# Patient Record
Sex: Male | Born: 1980 | Race: White | Hispanic: No | Marital: Single | Smoking: Never smoker
Health system: Southern US, Community
[De-identification: ages and names within clinical notes are randomized; demographics above are authoritative.]

## PROBLEM LIST (undated history)

## (undated) DIAGNOSIS — R091 Pleurisy: Secondary | ICD-10-CM

## (undated) DIAGNOSIS — L309 Dermatitis, unspecified: Secondary | ICD-10-CM

## (undated) HISTORY — DX: Dermatitis, unspecified: L30.9

---
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
## 2012-03-25 ENCOUNTER — Emergency Department (HOSPITAL_BASED_OUTPATIENT_CLINIC_OR_DEPARTMENT_OTHER)
Admission: EM | Admit: 2012-03-25 | Discharge: 2012-03-25 | Disposition: A | Discharge status: Home / Self Care | Payer: BC Managed Care – PPO | Attending: Emergency Medicine | Admitting: Emergency Medicine

## 2012-03-25 ENCOUNTER — Encounter (HOSPITAL_BASED_OUTPATIENT_CLINIC_OR_DEPARTMENT_OTHER): Payer: Self-pay | Admitting: Family Medicine

## 2012-03-25 DIAGNOSIS — T622X1A Toxic effect of other ingested (parts of) plant(s), accidental (unintentional), initial encounter: Secondary | ICD-10-CM | POA: Insufficient documentation

## 2012-03-25 DIAGNOSIS — L237 Allergic contact dermatitis due to plants, except food: Secondary | ICD-10-CM

## 2012-03-25 DIAGNOSIS — L255 Unspecified contact dermatitis due to plants, except food: Secondary | ICD-10-CM | POA: Insufficient documentation

## 2012-03-25 MED ORDER — DIPHENHYDRAMINE HCL 25 MG PO TABS: 50.0000 mg | ORAL_TABLET | Freq: Four times a day (QID) | ORAL | Status: DC

## 2012-03-25 MED ORDER — PREDNISONE (PAK) 10 MG PO TABS: 10.0000 mg | ORAL_TABLET | Freq: Every day | ORAL | Status: AC

## 2012-03-25 NOTE — ED Provider Notes (Signed)
History     CSN: [id number]  Arrival date & time 03/25/12  1478   First MD Initiated Contact with Patient 03/25/12 1020      Chief Complaint  Patient presents with  . Poison [employer]    face    (Consider location/radiation/quality/duration/timing/severity/associated sxs/prior treatment) HPI Pt presents with itching rash over face, left hand and arm, bilateral feet, left side of neck.  He states he was working outside in the yard several days ago and the next day developed itching rash.  Has been applying benadryl ointment with some relief.  No lip/tongue swelling, no difficulty breathing, no eye involvement.  There are no alleviating or modifying factors, there are no other associated systemic symptoms  History reviewed. No pertinent past medical history.  History reviewed. No pertinent past surgical history.  No family history on file.  History  Substance Use Topics  . Smoking status: Never Smoker   . Smokeless tobacco: Not on file  . Alcohol Use: Yes      Review of Systems ROS reviewed and all otherwise negative except for mentioned in HPI  Allergies  Review of patient's allergies indicates no known allergies.  Home Medications   Current Outpatient Rx  Name Route Sig Dispense Refill  . DIPHENHYDRAMINE HCL 25 MG PO TABS Oral Take 2 tablets (50 mg total) by mouth every 6 (six) hours. Take 1-2 tablets every 6 hours x 2 days, then space out to an as needed basis 20 tablet 0  . PREDNISONE (PAK) 10 MG PO TABS Oral Take 1 tablet (10 mg total) by mouth daily. Take 4 tabs po daily x 4 days, then 3 tabs po daily x 4 days, then 2 tabs po daily x 4 days, then 1 tab po daily x 4 days 40 tablet 0    BP 119/59  Pulse 62  Temp(Src) 98.3 F (36.8 C) (Oral)  Resp 20  Ht 5\' 11"  (1.803 m)  Wt 185 lb (83.915 kg)  BMI 25.80 kg/m2  SpO2 100% Vitals reviewed Physical Exam Physical Examination: General appearance - alert, well appearing, and in no distress Mental status - alert,  oriented to person, place, and time Eyes - pupils equal and reactive, no conjunctival injection Mouth - mucous membranes moist, pharynx normal without lesions Chest - clear to auscultation, no wheezes, rales or rhonchi, symmetric air entry Heart - normal rate, regular rhythm, normal S1, S2, no murmurs, rubs, clicks or gallops Abdomen - soft, nontender, nondistended, no masses or organomegaly Extremities - peripheral pulses normal, no pedal edema, no clubbing or cyanosis Skin - normal coloration and turgor, vesicular/weeping erythematous rash over left forehead, left hand and forearm, left neck, bilateral ankles  ED Course  Procedures (including critical care time)  Labs Reviewed - No data to display No results found.   1. Poison ivy dermatitis       MDM  Pt presenting with pruritic rash with appearance most c/w poison ivy/contact dermatitis.  Pt recommended to take benadryl every 6 hours, will also prescribe steroids as rash is becoming diffuse.  Pt discharged with strict return precautions.  He is agreeable with this plan.         Ethelda Chick, MD 03/25/12 1348

## 2012-03-25 NOTE — ED Notes (Signed)
Pt sts he was working in bushes on Sunday and thinks he has poison ivy to face, neck, arm and feet. Pt currently has benadryl ointment on rash.

## 2012-03-25 NOTE — Discharge Instructions (Signed)
Return to the ED with any concerns including difficulty breathing, vomiting, lip or tongue swelling, eye pain or changes in vision, or any other alarming symptoms

## 2012-09-17 ENCOUNTER — Encounter (HOSPITAL_BASED_OUTPATIENT_CLINIC_OR_DEPARTMENT_OTHER): Payer: Self-pay | Admitting: Emergency Medicine

## 2012-09-17 ENCOUNTER — Emergency Department (HOSPITAL_BASED_OUTPATIENT_CLINIC_OR_DEPARTMENT_OTHER)
Admission: EM | Admit: 2012-09-17 | Discharge: 2012-09-17 | Disposition: A | Discharge status: Home / Self Care | Payer: Worker's Compensation | Attending: Emergency Medicine | Admitting: Emergency Medicine

## 2012-09-17 DIAGNOSIS — Z87891 Personal history of nicotine dependence: Secondary | ICD-10-CM | POA: Insufficient documentation

## 2012-09-17 DIAGNOSIS — S0083XA Contusion of other part of head, initial encounter: Secondary | ICD-10-CM

## 2012-09-17 DIAGNOSIS — Y990 Civilian activity done for income or pay: Secondary | ICD-10-CM | POA: Insufficient documentation

## 2012-09-17 DIAGNOSIS — Y9229 Other specified public building as the place of occurrence of the external cause: Secondary | ICD-10-CM | POA: Insufficient documentation

## 2012-09-17 DIAGNOSIS — IMO0002 Reserved for concepts with insufficient information to code with codable children: Secondary | ICD-10-CM | POA: Insufficient documentation

## 2012-09-17 DIAGNOSIS — S1093XA Contusion of unspecified part of neck, initial encounter: Secondary | ICD-10-CM | POA: Insufficient documentation

## 2012-09-17 DIAGNOSIS — S0003XA Contusion of scalp, initial encounter: Secondary | ICD-10-CM | POA: Insufficient documentation

## 2012-09-17 DIAGNOSIS — Z23 Encounter for immunization: Secondary | ICD-10-CM | POA: Insufficient documentation

## 2012-09-17 DIAGNOSIS — Z79899 Other long term (current) drug therapy: Secondary | ICD-10-CM | POA: Insufficient documentation

## 2012-09-17 DIAGNOSIS — S0181XA Laceration without foreign body of other part of head, initial encounter: Secondary | ICD-10-CM

## 2012-09-17 MED ORDER — TETANUS-DIPHTH-ACELL PERTUSSIS 5-2.5-18.5 LF-MCG/0.5 IM SUSP
0.5000 mL | Freq: Once | INTRAMUSCULAR | Status: AC
  Administered 2012-09-17: 0.5 mL via INTRAMUSCULAR
  Filled 2012-09-17: qty 0.5

## 2012-09-17 NOTE — ED Provider Notes (Signed)
History     CSN: [id number]  Arrival date & time 09/17/12  0109   First MD Initiated Contact with Patient 09/17/12 0123      Chief Complaint  Patient presents with  . Facial Laceration    (Consider location/radiation/quality/duration/timing/severity/associated sxs/prior treatment) HPI This is a 31 year old male who had a strap sling up and strike him on the right forehead. This occurred at work at about 9:30 yesterday evening. He has a laceration above the right eyebrow. There is minimal pain but there was significant bleeding. The bleeding was relieved with pressure and is now hemostatic. He denies neck pain or other injury. His tetanus status not up to date.  History reviewed. No pertinent past medical history.  History reviewed. No pertinent past surgical history.  No family history on file.  History  Substance Use Topics  . Smoking status: Former Games developer  . Smokeless tobacco: Not on file  . Alcohol Use: Yes      Review of Systems  All other systems reviewed and are negative.    Allergies  Review of patient's allergies indicates no known allergies.  Home Medications   Current Outpatient Rx  Name  Route  Sig  Dispense  Refill  . DIPHENHYDRAMINE HCL 25 MG PO TABS   Oral   Take 2 tablets (50 mg total) by mouth every 6 (six) hours. Take 1-2 tablets every 6 hours x 2 days, then space out to an as needed basis   20 tablet   0     BP 147/82  Pulse 89  Temp 98.4 F (36.9 C) (Oral)  Resp 18  Ht 5\' 11"  (1.803 m)  Wt 215 lb (97.523 kg)  BMI 29.99 kg/m2  SpO2 100%  Physical Exam General: Well-developed, well-nourished male in no acute distress; appearance consistent with age of record HENT: normocephalic; superficial contusion and abrasion of the right upper for head; small hematoma with overlying laceration above the right eyebrow Eyes: pupils equal round and reactive to light; extraocular muscles intact Neck: supple; nontender Heart: regular rate and  rhythm Lungs: clear to auscultation bilaterally Abdomen: soft; nondistended Extremities: No deformity; full range of motion Neurologic: Awake, alert and oriented; motor function intact in all extremities and symmetric; no facial droop Skin: Warm and dry Psychiatric: Normal mood and affect    ED Course  Procedures (including critical care time)  LACERATION REPAIR Performed by: Faithanne Verret L Authorized by: Hanley Seamen Consent: Verbal consent obtained. Risks and benefits: risks, benefits and alternatives were discussed Consent given by: patient Patient identity confirmed: provided demographic data Prepped and Draped in normal sterile fashion Wound explored  Laceration Location: Above right eyebrow  Laceration Length: 1.7 cm  No Foreign Bodies seen or palpated  Anesthesia: None   Irrigation method: syringe Amount of cleaning: standard  Skin closure: Dermabond   Patient tolerance: Patient tolerated the procedure well with no immediate complications.    MDM          Hanley Seamen, MD 09/17/12 [phone]

## 2012-09-17 NOTE — ED Notes (Signed)
Pt hit in forehead by metal end of a strap while at work. Lac above right eyebrow.

## 2012-10-04 ENCOUNTER — Emergency Department (HOSPITAL_BASED_OUTPATIENT_CLINIC_OR_DEPARTMENT_OTHER): Payer: Self-pay

## 2012-10-04 ENCOUNTER — Emergency Department (HOSPITAL_BASED_OUTPATIENT_CLINIC_OR_DEPARTMENT_OTHER)
Admission: EM | Admit: 2012-10-04 | Discharge: 2012-10-04 | Disposition: A | Discharge status: Home / Self Care | Payer: Self-pay | Attending: Emergency Medicine | Admitting: Emergency Medicine

## 2012-10-04 ENCOUNTER — Encounter (HOSPITAL_BASED_OUTPATIENT_CLINIC_OR_DEPARTMENT_OTHER): Payer: Self-pay | Admitting: Emergency Medicine

## 2012-10-04 DIAGNOSIS — Z8709 Personal history of other diseases of the respiratory system: Secondary | ICD-10-CM | POA: Insufficient documentation

## 2012-10-04 DIAGNOSIS — J40 Bronchitis, not specified as acute or chronic: Secondary | ICD-10-CM | POA: Insufficient documentation

## 2012-10-04 DIAGNOSIS — R0602 Shortness of breath: Secondary | ICD-10-CM | POA: Insufficient documentation

## 2012-10-04 DIAGNOSIS — Z87891 Personal history of nicotine dependence: Secondary | ICD-10-CM | POA: Insufficient documentation

## 2012-10-04 HISTORY — DX: Pleurisy: R09.1

## 2012-10-04 MED ORDER — ALBUTEROL SULFATE (5 MG/ML) 0.5% IN NEBU
5.0000 mg | INHALATION_SOLUTION | Freq: Once | RESPIRATORY_TRACT | Status: AC
  Administered 2012-10-04: 5 mg via RESPIRATORY_TRACT
  Filled 2012-10-04: qty 1

## 2012-10-04 MED ORDER — ALBUTEROL SULFATE HFA 108 (90 BASE) MCG/ACT IN AERS: 1.0000 | INHALATION_SPRAY | Freq: Four times a day (QID) | RESPIRATORY_TRACT | Status: AC | PRN

## 2012-10-04 MED ORDER — IPRATROPIUM BROMIDE 0.02 % IN SOLN
0.5000 mg | Freq: Once | RESPIRATORY_TRACT | Status: AC
  Administered 2012-10-04: 0.5 mg via RESPIRATORY_TRACT
  Filled 2012-10-04: qty 2.5

## 2012-10-04 MED ORDER — AZITHROMYCIN 250 MG PO TABS: 250.0000 mg | ORAL_TABLET | Freq: Every day | ORAL | Status: DC

## 2012-10-04 NOTE — ED Provider Notes (Signed)
History     CSN: [id number]  Arrival date & time 10/04/12  1226   First MD Initiated Contact with Patient 10/04/12 1303      Chief Complaint  Patient presents with  . Cough  . Shortness of Breath    (Consider location/radiation/quality/duration/timing/severity/associated sxs/prior treatment) Patient is a 31 y.o. male presenting with cough. The history is provided by the patient. No language interpreter was used.  Cough This is a new problem. Episode onset: 4 days. The problem occurs constantly. The problem has been gradually worsening. The cough is productive of sputum. There has been no fever. Associated symptoms include shortness of breath. He has tried nothing for the symptoms. The treatment provided no relief. He is not a smoker. His past medical history is significant for bronchitis.  Pt complains of cough and shortness of breath  Past Medical History  Diagnosis Date  . Pleurisy     No past surgical history on file.  No family history on file.  History  Substance Use Topics  . Smoking status: Former Games developer  . Smokeless tobacco: Not on file  . Alcohol Use: Yes      Review of Systems  Respiratory: Positive for cough and shortness of breath.   All other systems reviewed and are negative.    Allergies  Review of patient's allergies indicates no known allergies.  Home Medications  No current outpatient prescriptions on file.  BP 123/67  Pulse 61  Temp 98.4 F (36.9 C) (Oral)  Resp 22  SpO2 100%  Physical Exam  Nursing note and vitals reviewed. Constitutional: He appears well-developed and well-nourished.  HENT:  Head: Normocephalic and atraumatic.  Right Ear: External ear normal.  Left Ear: External ear normal.  Mouth/Throat: Oropharynx is clear and moist.  Eyes: Pupils are equal, round, and reactive to light.  Neck: Normal range of motion. Neck supple.  Cardiovascular: Normal rate.   Pulmonary/Chest: Effort normal.  Abdominal: Soft.   Musculoskeletal: Normal range of motion.  Neurological: He is alert.  Skin: Skin is warm.  Psychiatric: He has a normal mood and affect.    ED Course  Procedures (including critical care time)  Labs Reviewed - No data to display Dg Chest 2 View  10/04/2012  *RADIOLOGY REPORT*  Clinical Data: Cough  CHEST - 2 VIEW  Comparison: None.  Findings: Cardiomediastinal silhouette is unremarkable.  No acute infiltrate or pleural effusion.  No pulmonary edema.  Thoracic dextroscoliosis.  IMPRESSION:  No active disease .  Thoracic dextroscoliosis.   Original Report Authenticated By: Natasha Mead, M.D.      No diagnosis found.    MDM  Pt given albuterol and atrovent neb with relief.   Pt given rx for albuterol and zithromax.   Pt given primary care referrals       Lonia Skinner [place], [place] 10/04/12 1415

## 2012-10-04 NOTE — ED Notes (Signed)
Pt having chest congestion, productive cough, brown sputum x 4 days.  Some sob.  Worse sob with exertion.  No known fever.  Some audible wheezing.

## 2012-10-04 NOTE — ED Notes (Signed)
PA at bedside.

## 2012-10-05 NOTE — ED Provider Notes (Signed)
History/physical exam/procedure(s) were performed by non-physician practitioner and as supervising physician I was immediately available for consultation/collaboration. I have reviewed all notes and am in agreement with care and plan.   Lindell Tussey S Dasani Crear, MD 10/05/12 1519 

## 2012-11-04 ENCOUNTER — Emergency Department (HOSPITAL_BASED_OUTPATIENT_CLINIC_OR_DEPARTMENT_OTHER)
Admission: EM | Admit: 2012-11-04 | Discharge: 2012-11-04 | Disposition: A | Discharge status: Home / Self Care | Payer: Self-pay | Attending: Emergency Medicine | Admitting: Emergency Medicine

## 2012-11-04 ENCOUNTER — Encounter (HOSPITAL_BASED_OUTPATIENT_CLINIC_OR_DEPARTMENT_OTHER): Payer: Self-pay | Admitting: *Deleted

## 2012-11-04 ENCOUNTER — Emergency Department (HOSPITAL_BASED_OUTPATIENT_CLINIC_OR_DEPARTMENT_OTHER): Payer: Self-pay

## 2012-11-04 DIAGNOSIS — Z8709 Personal history of other diseases of the respiratory system: Secondary | ICD-10-CM | POA: Insufficient documentation

## 2012-11-04 DIAGNOSIS — J40 Bronchitis, not specified as acute or chronic: Secondary | ICD-10-CM | POA: Insufficient documentation

## 2012-11-04 DIAGNOSIS — Z87891 Personal history of nicotine dependence: Secondary | ICD-10-CM | POA: Insufficient documentation

## 2012-11-04 MED ORDER — HYDROCOD POLST-CHLORPHEN POLST 10-8 MG/5ML PO LQCR: 5.0000 mL | Freq: Two times a day (BID) | ORAL | Status: DC | PRN

## 2012-11-04 MED ORDER — AZITHROMYCIN 250 MG PO TABS: 250.0000 mg | ORAL_TABLET | Freq: Every day | ORAL | Status: DC

## 2012-11-04 NOTE — ED Notes (Signed)
Patient transported to X-ray 

## 2012-11-04 NOTE — ED Provider Notes (Signed)
History     CSN: [id number]  Arrival date & time 11/04/12  5284   First MD Initiated Contact with Patient 11/04/12 0335      Chief Complaint  Patient presents with  . Cough    (Consider location/radiation/quality/duration/timing/severity/associated sxs/prior treatment) HPI This is a 32 year old male with several days of cold symptoms. Specifically he has been having nasal congestion, sinus drainage and cough. The cough has been nonproductive. It worsened this morning. He also had chills this morning. He is not sure if he is had a fever. He has had posttussive emesis as a result of coughing spells. He has not had diarrhea. He has an albuterol inhaler but has not been using it.  Past Medical History  Diagnosis Date  . Pleurisy     History reviewed. No pertinent past surgical history.  History reviewed. No pertinent family history.  History  Substance Use Topics  . Smoking status: Former Games developer  . Smokeless tobacco: Not on file  . Alcohol Use: Yes      Review of Systems  All other systems reviewed and are negative.    Allergies  Review of patient's allergies indicates no known allergies.  Home Medications   Current Outpatient Rx  Name  Route  Sig  Dispense  Refill  . ALBUTEROL SULFATE HFA 108 (90 BASE) MCG/ACT IN AERS   Inhalation   Inhale 1-2 puffs into the lungs every 6 (six) hours as needed for wheezing.   1 Inhaler   0   . AZITHROMYCIN 250 MG PO TABS   Oral   Take 1 tablet (250 mg total) by mouth daily. Take first 2 tablets together, then 1 every day until finished.   6 tablet   0     BP 138/92  Pulse 87  Temp 99.1 F (37.3 C) (Oral)  Resp 18  Ht 6' (1.829 m)  Wt 205 lb (92.987 kg)  BMI 27.80 kg/m2  SpO2 99%  Physical Exam General: Well-developed, well-nourished male in no acute distress; appearance consistent with age of record HENT: normocephalic, atraumatic; nasal congestion Eyes: pupils equal round and reactive to light; extraocular  muscles intact Neck: supple Heart: regular rate and rhythm Lungs: Shallow breaths with cough on attempted deep respirations Abdomen: soft; nondistended Extremities: No deformity; full range of motion Neurologic: Awake, alert and oriented; motor function intact in all extremities and symmetric; no facial droop Skin: Warm and dry Psychiatric: Normal mood and affect    ED Course  Procedures (including critical care time)    MDM  Nursing notes and vitals signs, including pulse oximetry, reviewed.  Summary of this visit's results, reviewed by myself:  Imaging Studies: Dg Chest 2 View  11/04/2012  *RADIOLOGY REPORT*  Clinical Data: Cough and congestion.  CHEST - 2 VIEW  Comparison: PA and lateral chest 10/04/2012.  Findings: Lungs clear.  Heart size normal.  No pneumothorax or pleural fluid.  Thoracolumbar scoliosis is noted.  IMPRESSION: No acute finding.  Stable compared to prior exam.   Original Report Authenticated By: Holley Dexter, M.D.             Hanley Seamen, MD 11/04/12 [phone]

## 2012-11-04 NOTE — ED Notes (Signed)
C/o cough for few days with chills and possible fever.

## 2012-11-04 NOTE — ED Notes (Signed)
MD at bedside. 

## 2013-01-06 ENCOUNTER — Emergency Department (HOSPITAL_BASED_OUTPATIENT_CLINIC_OR_DEPARTMENT_OTHER)
Admission: EM | Admit: 2013-01-06 | Discharge: 2013-01-06 | Disposition: A | Discharge status: Home / Self Care | Payer: BC Managed Care – PPO | Attending: Emergency Medicine | Admitting: Emergency Medicine

## 2013-01-06 ENCOUNTER — Encounter (HOSPITAL_BASED_OUTPATIENT_CLINIC_OR_DEPARTMENT_OTHER): Payer: Self-pay | Admitting: *Deleted

## 2013-01-06 DIAGNOSIS — R197 Diarrhea, unspecified: Secondary | ICD-10-CM | POA: Insufficient documentation

## 2013-01-06 DIAGNOSIS — R110 Nausea: Secondary | ICD-10-CM | POA: Insufficient documentation

## 2013-01-06 DIAGNOSIS — Z79899 Other long term (current) drug therapy: Secondary | ICD-10-CM | POA: Insufficient documentation

## 2013-01-06 DIAGNOSIS — Z8709 Personal history of other diseases of the respiratory system: Secondary | ICD-10-CM | POA: Insufficient documentation

## 2013-01-06 DIAGNOSIS — Z87891 Personal history of nicotine dependence: Secondary | ICD-10-CM | POA: Insufficient documentation

## 2013-01-06 LAB — CBC WITH DIFFERENTIAL/PLATELET
Band Neutrophils: 0 % (ref 0–10)
Basophils Absolute: 0.1 10*3/uL (ref 0.0–0.1)
Basophils Relative: 1 % (ref 0–1)
Eosinophils Absolute: 0.3 10*3/uL (ref 0.0–0.7)
Eosinophils Relative: 3 % (ref 0–5)
HCT: 45.5 % (ref 39.0–52.0)
Hemoglobin: 15.8 g/dL (ref 13.0–17.0)
Lymphocytes Relative: 25 % (ref 12–46)
Lymphs Abs: 2.5 10*3/uL (ref 0.7–4.0)
MCV: 89.4 fL (ref 78.0–100.0)
Metamyelocytes Relative: 0 %
Monocytes Absolute: 0.1 10*3/uL (ref 0.1–1.0)
Monocytes Relative: 1 % — ABNORMAL LOW (ref 3–12)
RDW: 12.8 % (ref 11.5–15.5)
WBC: 9.9 10*3/uL (ref 4.0–10.5)

## 2013-01-06 LAB — COMPREHENSIVE METABOLIC PANEL
Albumin: 4.2 g/dL (ref 3.5–5.2)
Alkaline Phosphatase: 77 U/L (ref 39–117)
BUN: 12 mg/dL (ref 6–23)
Calcium: 9.4 mg/dL (ref 8.4–10.5)
GFR calc Af Amer: >90 mL/min (ref >90)
Glucose, Bld: 89 mg/dL (ref 70–99)
Potassium: 4.1 mEq/L (ref 3.5–5.1)
Sodium: 142 mEq/L (ref 135–145)
Total Protein: 7.5 g/dL (ref 6.0–8.3)

## 2013-01-06 MED ORDER — LOPERAMIDE HCL 2 MG PO CAPS: 2.0000 mg | ORAL_CAPSULE | Freq: Four times a day (QID) | ORAL | Status: DC | PRN

## 2013-01-06 MED ORDER — ONDANSETRON 4 MG PO TBDP: ORAL_TABLET | ORAL | Status: DC

## 2013-01-06 MED ORDER — SODIUM CHLORIDE 0.9 % IV BOLUS (SEPSIS): 1000.0000 mL | Freq: Once | INTRAVENOUS | Status: DC

## 2013-01-06 NOTE — ED Notes (Signed)
C/o abd pain and bloating since Monday with nausea and diarrhea

## 2013-01-06 NOTE — ED Provider Notes (Signed)
History     CSN: [id number]  Arrival date & time 01/06/13  2130   First MD Initiated Contact with Patient 01/06/13 [phone]      Chief Complaint  Patient presents with  . Nausea  . Abdominal Pain    (Consider location/radiation/quality/duration/timing/severity/associated sxs/prior treatment) HPI Pt states he ate [place] food on Saturday and noticed upset stomach and nausea on Sunday which progressed to multiple episodes of diarrhea on Monday through today. No vomiting. No fever. No abd pain other than mild cramping. +abd distention. No recent travel. Has child with similar GI illness. No blood in stool. No abd surgeries.  Past Medical History  Diagnosis Date  . Pleurisy     History reviewed. No pertinent past surgical history.  History reviewed. No pertinent family history.  History  Substance Use Topics  . Smoking status: Former Games developer  . Smokeless tobacco: Not on file  . Alcohol Use: Yes      Review of Systems  Constitutional: Negative for fever and chills.  Respiratory: Negative for cough and shortness of breath.   Cardiovascular: Negative for chest pain.  Gastrointestinal: Positive for nausea, diarrhea and abdominal distention. Negative for vomiting, abdominal pain, constipation and blood in stool.  Musculoskeletal: Negative for back pain.  Skin: Negative for rash and wound.  Neurological: Negative for dizziness, weakness, numbness and headaches.  All other systems reviewed and are negative.    Allergies  Review of patient's allergies indicates no known allergies.  Home Medications   Current Outpatient Rx  Name  Route  Sig  Dispense  Refill  . albuterol (PROVENTIL HFA;VENTOLIN HFA) 108 (90 BASE) MCG/ACT inhaler   Inhalation   Inhale 1-2 puffs into the lungs every 6 (six) hours as needed for wheezing.   1 Inhaler   0   . azithromycin (ZITHROMAX) 250 MG tablet   Oral   Take 1 tablet (250 mg total) by mouth daily. Take first 2 tablets together, then 1 every  day until finished.   6 tablet   0   . chlorpheniramine-HYDROcodone (TUSSIONEX PENNKINETIC ER) 10-8 MG/5ML LQCR   Oral   Take 5 mLs by mouth every 12 (twelve) hours as needed (for cough).   115 mL   0   . loperamide (IMODIUM) 2 MG capsule   Oral   Take 1 capsule (2 mg total) by mouth 4 (four) times daily as needed for diarrhea or loose stools.   12 capsule   0   . ondansetron (ZOFRAN ODT) 4 MG disintegrating tablet      4mg  ODT q4 hours prn nausea/vomit   10 tablet   0     BP 118/64  Pulse 64  Temp(Src) 98.4 F (36.9 C) (Oral)  Resp 16  Wt 192 lb (87.091 kg)  BMI 26.03 kg/m2  SpO2 100%  Physical Exam  Nursing note and vitals reviewed. Constitutional: He is oriented to person, place, and time. He appears well-developed and well-nourished. No distress.  HENT:  Head: Normocephalic and atraumatic.  Mouth/Throat: Oropharynx is clear and moist.  Eyes: EOM are normal. Pupils are equal, round, and reactive to light.  Neck: Normal range of motion. Neck supple.  Cardiovascular: Normal rate and regular rhythm.   Pulmonary/Chest: Effort normal and breath sounds normal. No respiratory distress. He has no wheezes. He has no rales.  Abdominal: Soft. He exhibits distension (mild distention. +tympanic  ). He exhibits no mass. There is no tenderness. There is no rebound and no guarding.  Musculoskeletal: Normal range of  motion. He exhibits no edema and no tenderness.  Neurological: He is alert and oriented to person, place, and time.  Skin: Skin is warm and dry. No rash noted. No erythema.  Psychiatric: He has a normal mood and affect. His behavior is normal.    ED Course  Procedures (including critical care time)  Labs Reviewed  COMPREHENSIVE METABOLIC PANEL - Abnormal; Notable for the following:    ALT 59 (*)    All other components within normal limits  CBC WITH DIFFERENTIAL - Abnormal; Notable for the following:    Monocytes Relative 1 (*)    All other components within  normal limits   No results found.   1. Diarrhea       MDM  Normal labs. Pt with stable VS and soft abd. Will treat symptomatically and give return precautions.         Loren Racer, MD 01/06/13 1530

## 2013-05-31 ENCOUNTER — Emergency Department (HOSPITAL_BASED_OUTPATIENT_CLINIC_OR_DEPARTMENT_OTHER)
Admission: EM | Admit: 2013-05-31 | Discharge: 2013-05-31 | Disposition: A | Discharge status: Home / Self Care | Payer: BC Managed Care – PPO | Attending: Emergency Medicine | Admitting: Emergency Medicine

## 2013-05-31 ENCOUNTER — Encounter (HOSPITAL_BASED_OUTPATIENT_CLINIC_OR_DEPARTMENT_OTHER): Payer: Self-pay | Admitting: *Deleted

## 2013-05-31 DIAGNOSIS — R05 Cough: Secondary | ICD-10-CM | POA: Insufficient documentation

## 2013-05-31 DIAGNOSIS — J3489 Other specified disorders of nose and nasal sinuses: Secondary | ICD-10-CM | POA: Insufficient documentation

## 2013-05-31 DIAGNOSIS — R0982 Postnasal drip: Secondary | ICD-10-CM | POA: Insufficient documentation

## 2013-05-31 DIAGNOSIS — R059 Cough, unspecified: Secondary | ICD-10-CM | POA: Insufficient documentation

## 2013-05-31 DIAGNOSIS — Z87891 Personal history of nicotine dependence: Secondary | ICD-10-CM | POA: Insufficient documentation

## 2013-05-31 DIAGNOSIS — Z8709 Personal history of other diseases of the respiratory system: Secondary | ICD-10-CM | POA: Insufficient documentation

## 2013-05-31 DIAGNOSIS — R51 Headache: Secondary | ICD-10-CM | POA: Insufficient documentation

## 2013-05-31 DIAGNOSIS — R062 Wheezing: Secondary | ICD-10-CM | POA: Insufficient documentation

## 2013-05-31 DIAGNOSIS — R0789 Other chest pain: Secondary | ICD-10-CM | POA: Insufficient documentation

## 2013-05-31 DIAGNOSIS — J069 Acute upper respiratory infection, unspecified: Secondary | ICD-10-CM | POA: Insufficient documentation

## 2013-05-31 MED ORDER — HYDROCOD POLST-CHLORPHEN POLST 10-8 MG/5ML PO LQCR: 5.0000 mL | Freq: Two times a day (BID) | ORAL | Status: AC | PRN

## 2013-05-31 MED ORDER — DEXAMETHASONE SODIUM PHOSPHATE 10 MG/ML IJ SOLN
10.0000 mg | Freq: Once | INTRAMUSCULAR | Status: AC
  Administered 2013-05-31: 10 mg via INTRAMUSCULAR
  Filled 2013-05-31: qty 1

## 2013-05-31 MED ORDER — SALINE SPRAY 0.65 % NA SOLN: 1.0000 | NASAL | Status: DC | PRN

## 2013-05-31 MED ORDER — ALBUTEROL SULFATE HFA 108 (90 BASE) MCG/ACT IN AERS
2.0000 | INHALATION_SPRAY | Freq: Once | RESPIRATORY_TRACT | Status: AC
  Administered 2013-05-31: 2 via RESPIRATORY_TRACT
  Filled 2013-05-31: qty 6.7

## 2013-05-31 MED ORDER — ALBUTEROL SULFATE (5 MG/ML) 0.5% IN NEBU
5.0000 mg | INHALATION_SOLUTION | Freq: Once | RESPIRATORY_TRACT | Status: AC
  Administered 2013-05-31: 5 mg via RESPIRATORY_TRACT
  Filled 2013-05-31: qty 1

## 2013-05-31 NOTE — ED Provider Notes (Signed)
CSN: [id number]     Arrival date & time 05/31/13  0950 History     First MD Initiated Contact with Patient 05/31/13 [phone]     Chief Complaint  Patient presents with  . URI   (Consider location/radiation/quality/duration/timing/severity/associated sxs/prior Treatment) HPI Comments: Patient is a 32 yo M presenting to the ED for three day history of URI symptoms. Patient states he has had a minimally productive cough with associated post-tussive chest tightness and sinus pressure/headache with rhinorrhea. Patient denies alleviating factors, and states his cough and chest tightness is worse with exertion. Patient endorses his symptoms feel like previous bronchitis infections. Denies fevers or chills. PERC negative.   Past Medical History  Diagnosis Date  . Pleurisy    History reviewed. No pertinent past surgical history. No family history on file. History  Substance Use Topics  . Smoking status: Former Games developer  . Smokeless tobacco: Not on file  . Alcohol Use: Yes     Comment: occassionaly    Review of Systems  Constitutional: Negative for fever and chills.  HENT: Positive for rhinorrhea, postnasal drip and sinus pressure.   Respiratory: Positive for cough and chest tightness.   Cardiovascular: Negative for leg swelling.  All other systems reviewed and are negative.    Allergies  Review of patient's allergies indicates no known allergies.  Home Medications   Current Outpatient Rx  Name  Route  Sig  Dispense  Refill  . albuterol (PROVENTIL HFA;VENTOLIN HFA) 108 (90 BASE) MCG/ACT inhaler   Inhalation   Inhale 1-2 puffs into the lungs every 6 (six) hours as needed for wheezing.   1 Inhaler   0   . azithromycin (ZITHROMAX) 250 MG tablet   Oral   Take 1 tablet (250 mg total) by mouth daily. Take first 2 tablets together, then 1 every day until finished.   6 tablet   0   . chlorpheniramine-HYDROcodone (TUSSIONEX PENNKINETIC ER) 10-8 MG/5ML LQCR   Oral   Take 5 mLs by mouth  every 12 (twelve) hours as needed (for cough).   115 mL   0   . loperamide (IMODIUM) 2 MG capsule   Oral   Take 1 capsule (2 mg total) by mouth 4 (four) times daily as needed for diarrhea or loose stools.   12 capsule   0   . ondansetron (ZOFRAN ODT) 4 MG disintegrating tablet      4mg  ODT q4 hours prn nausea/vomit   10 tablet   0   . sodium chloride (OCEAN) 0.65 % SOLN nasal spray   Nasal   Place 1 spray into the nose as needed for congestion.   60 mL   0    BP 132/80  Pulse 78  Temp(Src) 98.4 F (36.9 C) (Oral)  Resp 20  Ht 5\' 11"  (1.803 m)  Wt 189 lb (85.73 kg)  BMI 26.37 kg/m2  SpO2 99% Physical Exam  Constitutional: He is oriented to person, place, and time. He appears well-developed and well-nourished. No distress.  HENT:  Head: Normocephalic and atraumatic.  Nose: Rhinorrhea present. Right sinus exhibits maxillary sinus tenderness. Left sinus exhibits maxillary sinus tenderness.  Mouth/Throat: Oropharynx is clear and moist.  Eyes: Conjunctivae are normal.  Neck: Neck supple.  Cardiovascular: Normal rate, regular rhythm, normal heart sounds and intact distal pulses.   Pulmonary/Chest: Effort normal. He has wheezes. He exhibits tenderness.  Neurological: He is alert and oriented to person, place, and time.  Skin: Skin is warm and dry. He is not  diaphoretic.  Psychiatric: He has a normal mood and affect.    ED Course   Procedures (including critical care time)  Medications  albuterol (PROVENTIL) (5 MG/ML) 0.5% nebulizer solution 5 mg (5 mg Nebulization Given 05/31/13 1019)  dexamethasone (DECADRON) injection 10 mg (10 mg Intramuscular Given 05/31/13 1024)  albuterol (PROVENTIL HFA;VENTOLIN HFA) 108 (90 BASE) MCG/ACT inhaler 2 puff (2 puffs Inhalation Given 05/31/13 1050)     Labs Reviewed - No data to display No results found. 1. URI (upper respiratory infection)     MDM  Patients symptoms are consistent with URI, likely viral etiology. Wheezes and  pleuritic pain improved after nebulizer and Decadron administration. Discussed that antibiotics are not indicated for viral infections. Pt will be discharged with symptomatic treatment.  Verbalizes understanding and is agreeable with plan. Pt is hemodynamically stable & in NAD prior to dc. Advised to obtain PCP for f/u. Return precautions discussed. Patient is agreeable to plan. Patient is stable at time of discharge     Jeannetta Ellis, PA-C 05/31/13 1658

## 2013-05-31 NOTE — ED Notes (Signed)
Patient states he has a two to three day history of sinus congestion, headache and tight cough.  States last night while working he felt short of breath.

## 2013-06-01 NOTE — ED Provider Notes (Signed)
Medical screening examination/treatment/procedure(s) were performed by non-physician practitioner and as supervising physician I was immediately available for consultation/collaboration.  Geoffery Lyons, MD 06/01/13 1535

## 2013-10-20 ENCOUNTER — Encounter (HOSPITAL_BASED_OUTPATIENT_CLINIC_OR_DEPARTMENT_OTHER): Payer: Self-pay | Admitting: Emergency Medicine

## 2013-10-20 ENCOUNTER — Emergency Department (HOSPITAL_BASED_OUTPATIENT_CLINIC_OR_DEPARTMENT_OTHER)
Admission: EM | Admit: 2013-10-20 | Discharge: 2013-10-20 | Disposition: A | Discharge status: Home / Self Care | Payer: BC Managed Care – PPO | Attending: Emergency Medicine | Admitting: Emergency Medicine

## 2013-10-20 DIAGNOSIS — Z87891 Personal history of nicotine dependence: Secondary | ICD-10-CM | POA: Insufficient documentation

## 2013-10-20 DIAGNOSIS — Z79899 Other long term (current) drug therapy: Secondary | ICD-10-CM | POA: Insufficient documentation

## 2013-10-20 DIAGNOSIS — J069 Acute upper respiratory infection, unspecified: Secondary | ICD-10-CM | POA: Insufficient documentation

## 2013-10-20 DIAGNOSIS — Z792 Long term (current) use of antibiotics: Secondary | ICD-10-CM | POA: Insufficient documentation

## 2013-10-20 MED ORDER — GUAIFENESIN-CODEINE 100-10 MG/5ML PO SOLN: 10.0000 mL | Freq: Four times a day (QID) | ORAL | Status: AC | PRN

## 2013-10-20 NOTE — ED Provider Notes (Signed)
CSN: [id number]     Arrival date & time 10/20/13  0500 History   First MD Initiated Contact with Patient 10/20/13 [phone]     Chief Complaint  Patient presents with  . Cough   (Consider location/radiation/quality/duration/timing/severity/associated sxs/prior Treatment) HPI Comments: 32 year old male for evaluation of fever cough and congestion for the past 2 days. He is here with his son and daughter who are ill with a similar fashion.  Patient is a 32 y.o. male presenting with cough. The history is provided by the patient.  Cough Cough characteristics:  Non-productive Severity:  Moderate Onset quality:  Gradual Duration:  2 days Timing:  Constant Progression:  Worsening Chronicity:  New Smoker: yes   Relieved by:  Nothing Worsened by:  Nothing tried Ineffective treatments:  None tried Associated symptoms: fever   Associated symptoms: no chest pain     Past Medical History  Diagnosis Date  . Pleurisy    History reviewed. No pertinent past surgical history. History reviewed. No pertinent family history. History  Substance Use Topics  . Smoking status: Former Games developer  . Smokeless tobacco: Not on file  . Alcohol Use: Yes     Comment: occassionaly    Review of Systems  Constitutional: Positive for fever.  Respiratory: Positive for cough.   Cardiovascular: Negative for chest pain.  All other systems reviewed and are negative.    Allergies  Review of patient's allergies indicates no known allergies.  Home Medications   Current Outpatient Rx  Name  Route  Sig  Dispense  Refill  . albuterol (PROVENTIL HFA;VENTOLIN HFA) 108 (90 BASE) MCG/ACT inhaler   Inhalation   Inhale 1-2 puffs into the lungs every 6 (six) hours as needed for wheezing.   1 Inhaler   0   . azithromycin (ZITHROMAX) 250 MG tablet   Oral   Take 1 tablet (250 mg total) by mouth daily. Take first 2 tablets together, then 1 every day until finished.   6 tablet   0   . chlorpheniramine-HYDROcodone  (TUSSIONEX PENNKINETIC ER) 10-8 MG/5ML LQCR   Oral   Take 5 mLs by mouth every 12 (twelve) hours as needed (for cough).   115 mL   0   . loperamide (IMODIUM) 2 MG capsule   Oral   Take 1 capsule (2 mg total) by mouth 4 (four) times daily as needed for diarrhea or loose stools.   12 capsule   0   . ondansetron (ZOFRAN ODT) 4 MG disintegrating tablet      4mg  ODT q4 hours prn nausea/vomit   10 tablet   0   . sodium chloride (OCEAN) 0.65 % SOLN nasal spray   Nasal   Place 1 spray into the nose as needed for congestion.   60 mL   0    BP 125/54  Pulse 110  Temp(Src) 100 F (37.8 C) (Oral)  Resp 18  Ht 5\' 11"  (1.803 m)  Wt 180 lb (81.647 kg)  BMI 25.12 kg/m2  SpO2 98% Physical Exam  Nursing note and vitals reviewed. Constitutional: He is oriented to person, place, and time. He appears well-developed and well-nourished. No distress.  HENT:  Head: Normocephalic and atraumatic.  Mouth/Throat: Oropharynx is clear and moist.  TMs are clear bilaterally.  Neck: Normal range of motion. Neck supple.  Cardiovascular: Normal rate, regular rhythm and normal heart sounds.   No murmur heard. Pulmonary/Chest: Effort normal and breath sounds normal. No respiratory distress. He has no wheezes.  Abdominal: Soft. Bowel sounds  are normal. He exhibits no distension. There is no tenderness.  Musculoskeletal: Normal range of motion. He exhibits no edema.  Lymphadenopathy:    He has no cervical adenopathy.  Neurological: He is alert and oriented to person, place, and time.  Skin: Skin is warm and dry. He is not diaphoretic.    ED Course  Procedures (including critical care time) Labs Review Labs Reviewed - No data to display Imaging Review No results found.    MDM  No diagnosis found. Symptoms appear viral in nature. There is no hypoxia and his lungs are clear. Will recommend Tylenol Motrin and will prescribe Robitussin with codeine. He is return or follow up if not improving or if  his situation worsens    Geoffery Lyons, MD 10/20/13 [phone]

## 2013-10-20 NOTE — ED Notes (Signed)
Pt reports fever and cough x2 days,

## 2014-01-26 IMAGING — CR DG CHEST 2V
2 series · 2 of 2 positions shown · non-contrast
Comparison: None.

CLINICAL DATA: Cough

CHEST - 2 VIEW

[w chest lat]
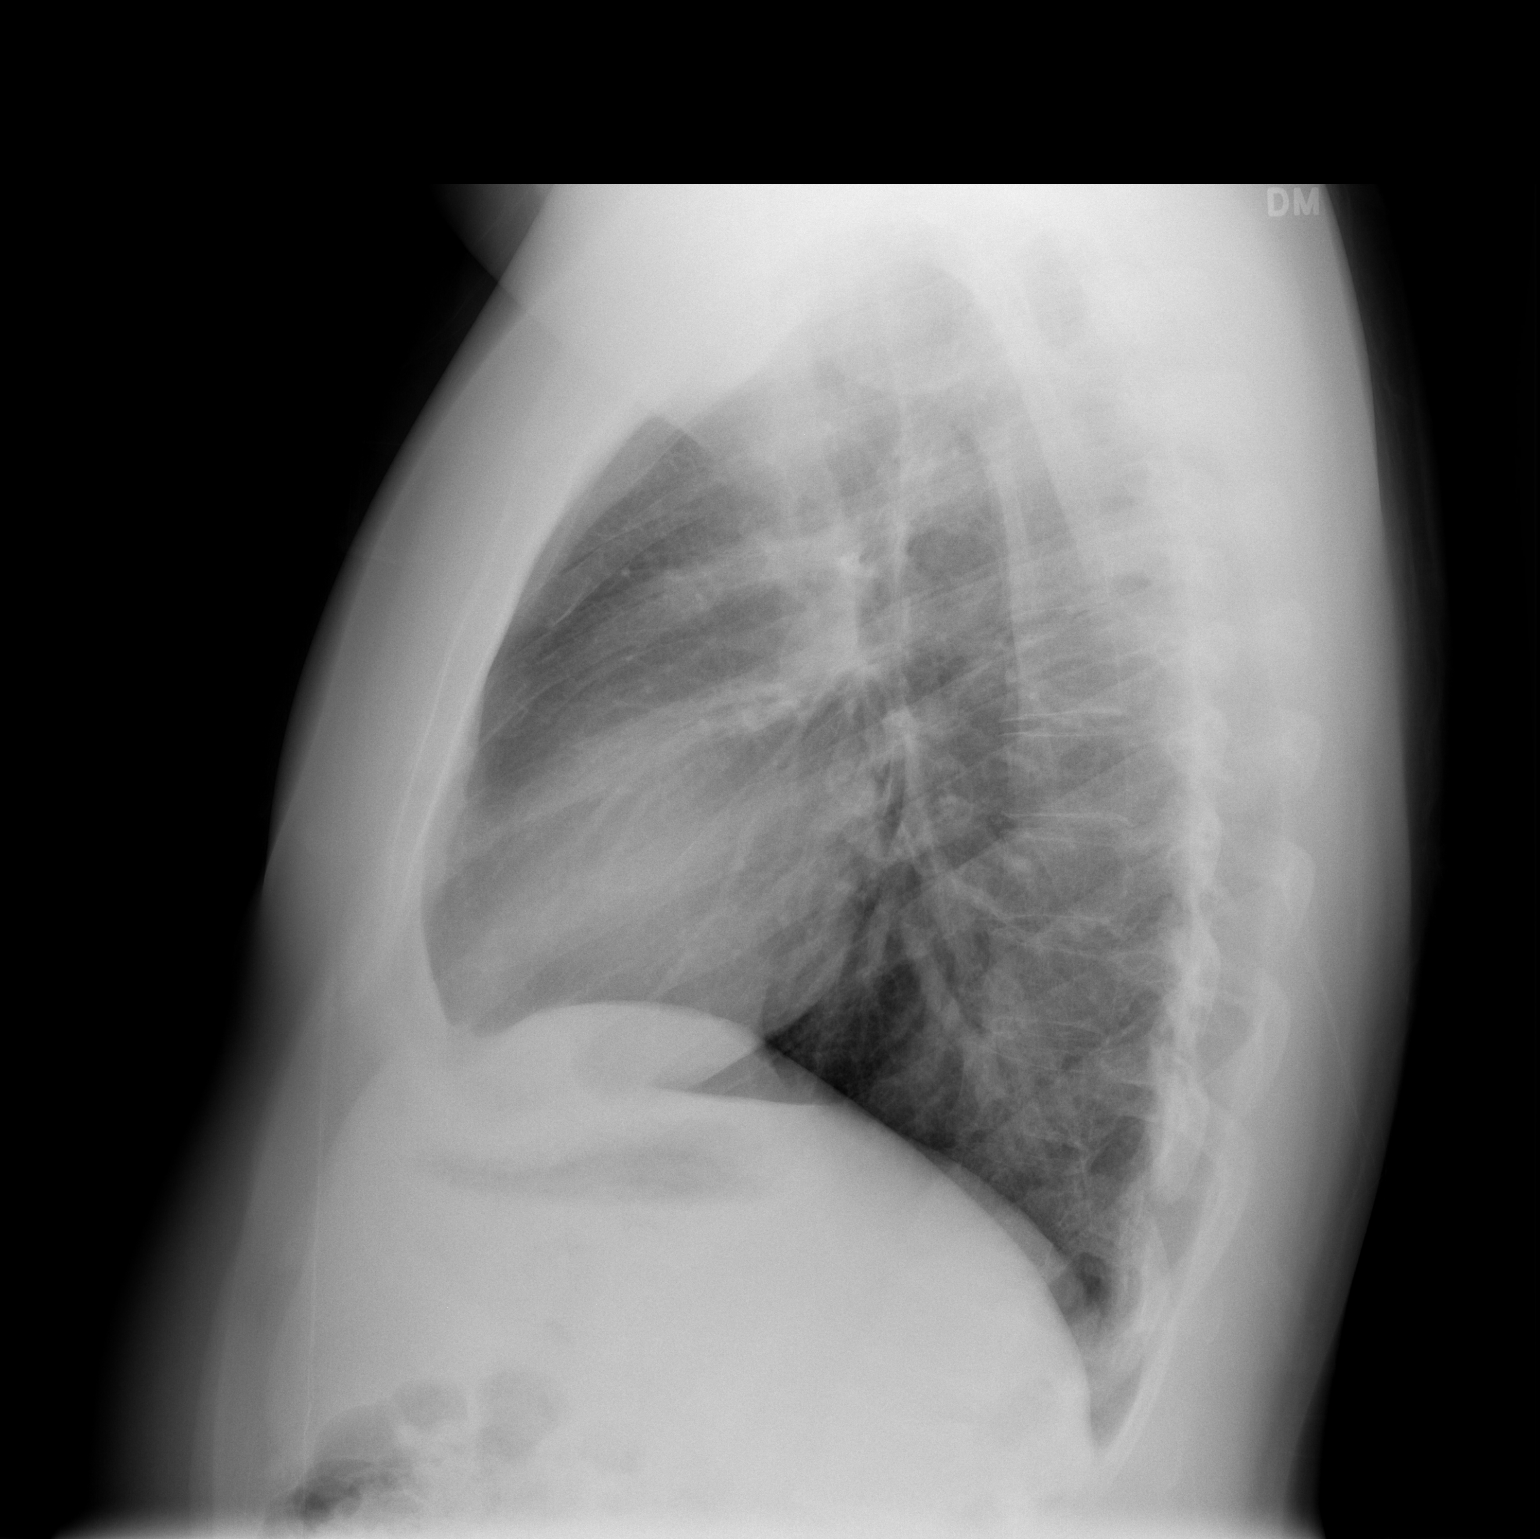

[w chest pa]
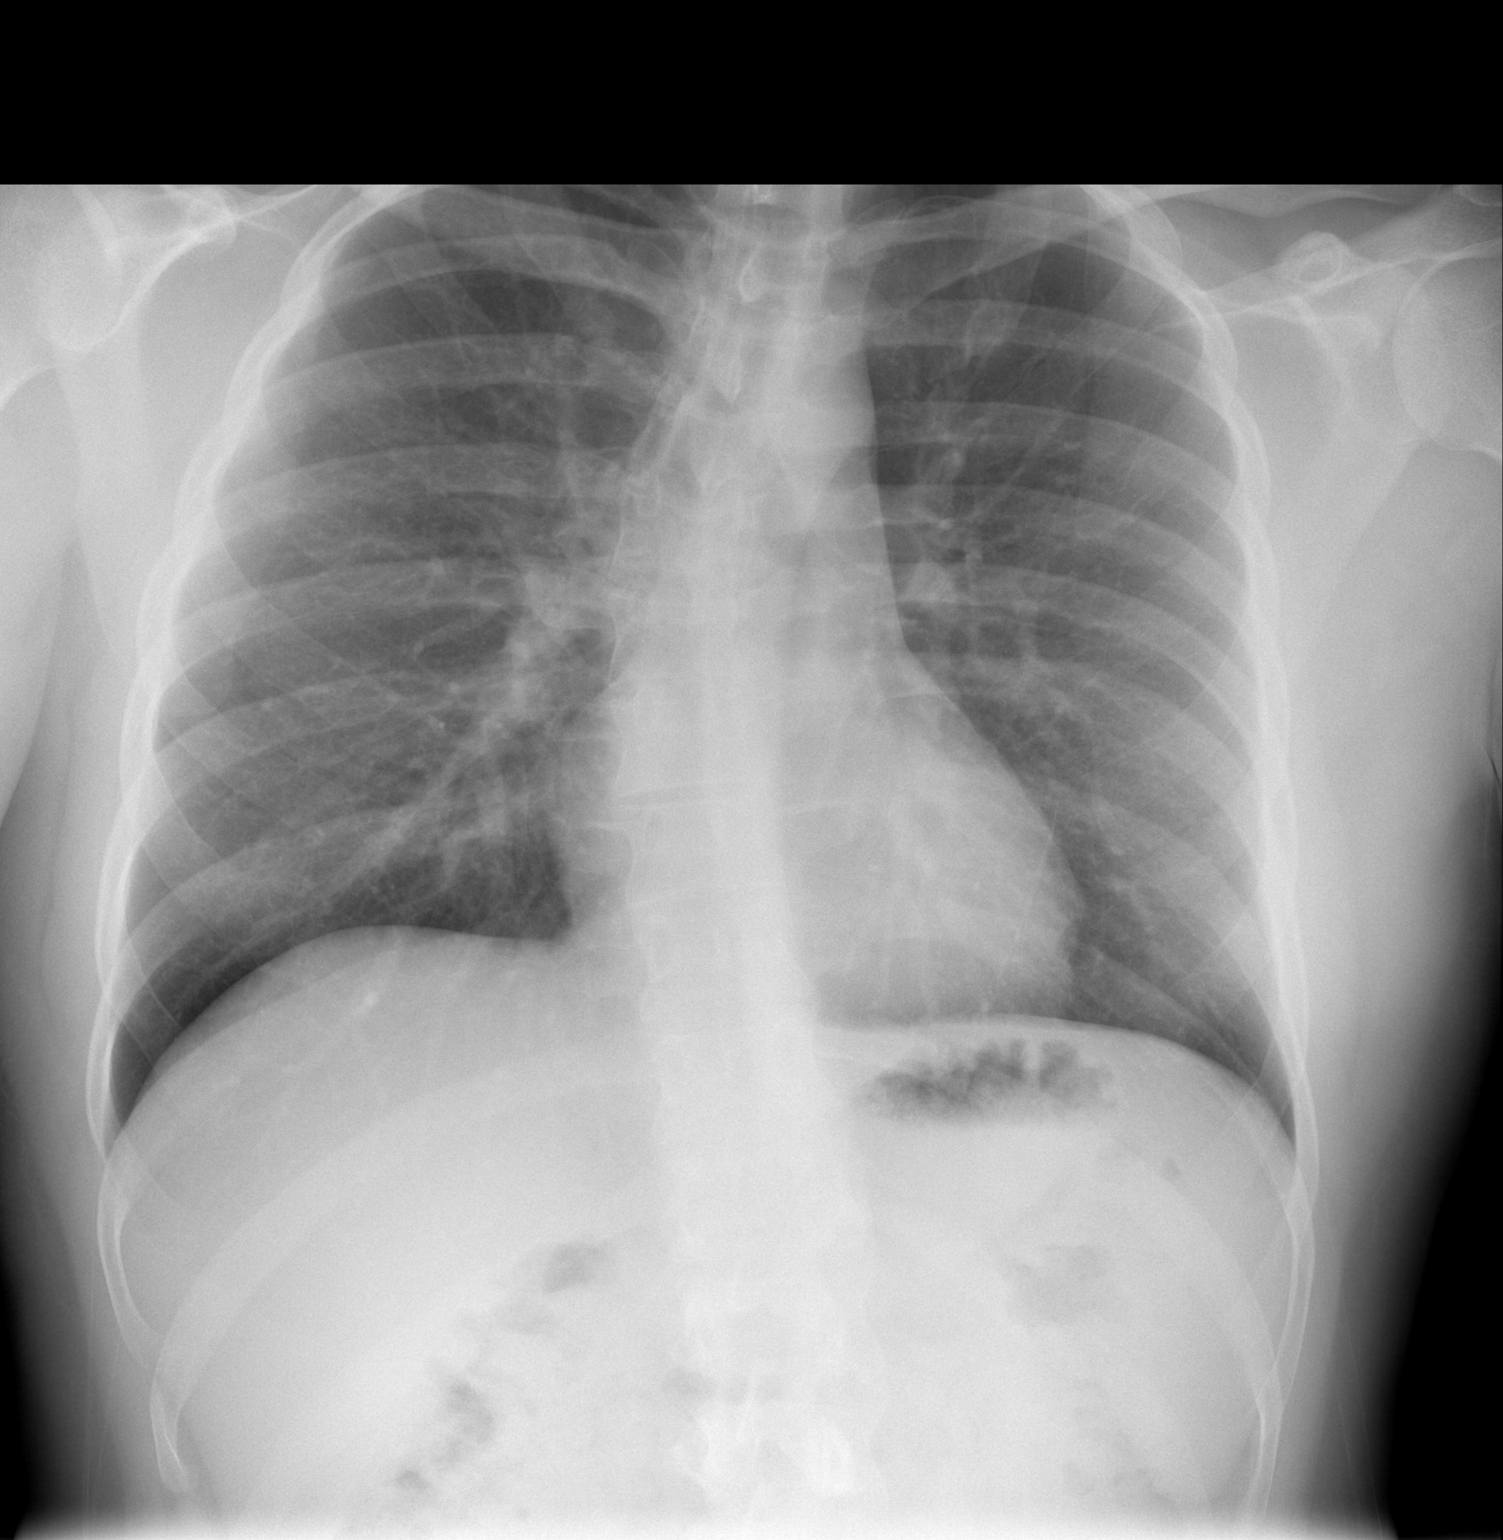

[2 of 2 positions shown; findings below may reference images not displayed]

FINDINGS: Cardiomediastinal silhouette is unremarkable.  No acute
infiltrate or pleural effusion.  No pulmonary edema.  Thoracic
dextroscoliosis.
IMPRESSION: No active disease .  Thoracic dextroscoliosis.

## 2017-02-09 ENCOUNTER — Emergency Department (HOSPITAL_BASED_OUTPATIENT_CLINIC_OR_DEPARTMENT_OTHER)
Admission: EM | Admit: 2017-02-09 | Discharge: 2017-02-09 | Disposition: A | Discharge status: Home / Self Care | Payer: Self-pay | Attending: Emergency Medicine | Admitting: Emergency Medicine

## 2017-02-09 ENCOUNTER — Encounter (HOSPITAL_BASED_OUTPATIENT_CLINIC_OR_DEPARTMENT_OTHER): Payer: Self-pay | Admitting: Emergency Medicine

## 2017-02-09 DIAGNOSIS — J039 Acute tonsillitis, unspecified: Secondary | ICD-10-CM

## 2017-02-09 DIAGNOSIS — J020 Streptococcal pharyngitis: Secondary | ICD-10-CM | POA: Insufficient documentation

## 2017-02-09 LAB — RAPID STREP SCREEN (MED CTR MEBANE ONLY): Streptococcus, Group A Screen (Direct): POSITIVE — AB

## 2017-02-09 MED ORDER — PENICILLIN G BENZATHINE 1200000 UNIT/2ML IM SUSP
1.2000 10*6.[IU] | Freq: Once | INTRAMUSCULAR | Status: AC
  Administered 2017-02-09: 1.2 10*6.[IU] via INTRAMUSCULAR
  Filled 2017-02-09: qty 2

## 2017-02-09 NOTE — ED Notes (Signed)
Dad reports sore throat for "a few days". Patient states his throat looks bad so he came in, son sick with same symptoms x4 days.

## 2017-02-09 NOTE — ED Notes (Signed)
ED Provider at bedside. 

## 2017-02-09 NOTE — ED Notes (Signed)
Patient advised he can leave at 1540, remaining in department for monitoring post IM antibiotics

## 2017-02-09 NOTE — ED Provider Notes (Signed)
[facility]   CSN: [id number] Arrival date & time: 02/09/17  1406     History   Chief Complaint Chief Complaint  Patient presents with  . Sore Throat    HPI Elwood A Adela Glimpse is a 36 y.o. otherwise healthy male who presents to the ED with complaints of sore throat 2 days. Positive sick contacts at home, son here with similar symptoms. Reports that his sore throat is constant moderate irritating in the posterior throat, nonradiating, worse with swallowing and with no treatments tried prior to arrival. He reports that his tonsils feel like they're swollen. He denies fevers, chills, rhinorrhea, cough, drooling, trismus, ear pain/drainage, CP, SOB, abd pain, N/V/D/C, hematuria, dysuria, myalgias, arthralgias, numbness, tingling, focal weakness, rashes, or any other complaints at this time.    The history is provided by the patient and medical records. No language interpreter was used.  Sore Throat  This is a new problem. The current episode started 2 days ago. The problem occurs constantly. The problem has not changed since onset.Pertinent negatives include no chest pain, no abdominal pain and no shortness of breath. The symptoms are aggravated by swallowing. Nothing relieves the symptoms. He has tried nothing for the symptoms. The treatment provided no relief.    History reviewed. No pertinent past medical history.  There are no active problems to display for this patient.   History reviewed. No pertinent surgical history.     Home Medications    Prior to Admission medications   Not on File    Family History No family history on file.  Social History Social History  Substance Use Topics  . Smoking status: Never Smoker  . Smokeless tobacco: Never Used  . Alcohol use Yes     Allergies   Patient has no known allergies.   Review of Systems Review of Systems  Constitutional: Negative for chills and fever.  HENT: Positive for sore throat.  Negative for drooling, ear discharge, ear pain, rhinorrhea and trouble swallowing.   Respiratory: Negative for cough and shortness of breath.   Cardiovascular: Negative for chest pain.  Gastrointestinal: Negative for abdominal pain, constipation, diarrhea, nausea and vomiting.  Genitourinary: Negative for dysuria and hematuria.  Musculoskeletal: Negative for arthralgias and myalgias.  Skin: Negative for color change.  Allergic/Immunologic: Negative for immunocompromised state.  Neurological: Negative for weakness and numbness.  Psychiatric/Behavioral: Negative for confusion.   10 Systems reviewed and are negative for acute change except as noted in the HPI.   Physical Exam Updated Vital Signs BP 126/78 (BP Location: Left Arm)   Pulse 77   Temp 99.2 F (37.3 C) (Oral)   Resp 18   Ht  (1.803 m)   Wt 85.7 kg   SpO2 100%   BMI 26.36 kg/m   Physical Exam  Constitutional: He is oriented to person, place, and time. Vital signs are normal. He appears well-developed and well-nourished.  Non-toxic appearance. No distress.  Afebrile, nontoxic, NAD  HENT:  Head: Normocephalic and atraumatic.  Nose: Nose normal.  Mouth/Throat: Uvula is midline and mucous membranes are normal. No trismus in the jaw. No uvula swelling. Oropharyngeal exudate, posterior oropharyngeal edema and posterior oropharyngeal erythema present. No tonsillar abscesses. Tonsils are 2+ on the right. Tonsils are 2+ on the left. Tonsillar exudate.  Nose clear. Oropharynx moderately injected, without uvular swelling or deviation, no trismus or drooling, with 2+ bilateral tonsillar swelling and erythema, +exudates coating both tonsils, no PTA.     Eyes: Conjunctivae and  EOM are normal. Right eye exhibits no discharge. Left eye exhibits no discharge.  Neck: Normal range of motion. Neck supple.  Cardiovascular: Normal rate, regular rhythm, normal heart sounds and intact distal pulses.  Exam reveals no gallop and no friction  rub.   No murmur heard. Pulmonary/Chest: Effort normal and breath sounds normal. No respiratory distress. He has no decreased breath sounds. He has no wheezes. He has no rhonchi. He has no rales.  Abdominal: Soft. Normal appearance and bowel sounds are normal. He exhibits no distension. There is no tenderness. There is no rigidity, no rebound, no guarding, no CVA tenderness, no tenderness at McBurney's point and negative Murphy's sign.  Musculoskeletal: Normal range of motion.  Lymphadenopathy:       Head (right side): Tonsillar adenopathy present.       Head (left side): Tonsillar adenopathy present.    He has cervical adenopathy.  Shotty cervical and tonsillar LAD bilaterally  Neurological: He is alert and oriented to person, place, and time. He has normal strength. No sensory deficit.  Skin: Skin is warm, dry and intact. No rash noted.  Psychiatric: He has a normal mood and affect.  Nursing note and vitals reviewed.    ED Treatments / Results  Labs (all labs ordered are listed, but only abnormal results are displayed) Labs Reviewed  RAPID STREP SCREEN (NOT AT [facility]) - Abnormal; Notable for the following:       Result Value   Streptococcus, Group A Screen (Direct) POSITIVE (*)    All other components within normal limits    EKG  EKG Interpretation None       Radiology No results found.  Procedures Procedures (including critical care time)  Medications Ordered in ED Medications  penicillin g benzathine (BICILLIN LA) 1200000 UNIT/2ML injection 1.2 Million Units (not administered)     Initial Impression / Assessment and Plan / ED Course  I have reviewed the triage vital signs and the nursing notes.  Pertinent labs & imaging results that were available during my care of the patient were reviewed by me and considered in my medical decision making (see chart for details).     36 y.o. male here with sore throat x2 days, +sick contacts at home. On exam, tonsillar 2+  swelling bilaterally with +exudates, no PTA, very erythematous. Afebrile and nontoxic, handling secretions well. +LAD. CENTOR score moderate, RST pending. Will reassess once RST returns  3:08 PM RST+. Given option of PO abx vs IM PCN, pt opted for IM PCN here. Advised OTC remedies for symptomatic relief, f/up with PCP in 1wk for recheck of symptoms. I explained the diagnosis and have given explicit precautions to return to the ER including for any other new or worsening symptoms. The patient understands and accepts the medical plan as it's been dictated and I have answered their questions. Discharge instructions concerning home care and prescriptions have been given. The patient is STABLE and is discharged to home in good condition.    Final Clinical Impressions(s) / ED Diagnoses   Final diagnoses:  Strep pharyngitis  Tonsillitis    New Prescriptions New Prescriptions   No medications on file     [address] PA-C 02/09/17 1517    Melene Plan, DO 02/10/17 1538

## 2017-02-09 NOTE — Discharge Instructions (Signed)
You were treated for strep throat today in the ER, you should start to feel better over the next few days. Continue to stay well-hydrated. Gargle warm salt water and spit it out. Use chloraseptic spray as needed for sore throat. Continue to alternate between Tylenol and Ibuprofen for pain or fever. Use Mucinex for cough suppression/expectoration of mucus. Use netipot and flonase to help with nasal congestion. May consider over-the-counter Benadryl or other antihistamine to decrease secretions and for help with your symptoms. Follow up with your primary care doctor in 5-7 days for recheck of ongoing symptoms. Return to emergency department for emergent changing or worsening of symptoms.

## 2017-02-09 NOTE — ED Triage Notes (Signed)
Sore throat x 1 day, denies other symptoms. Son with similar symptoms and fever.

## 2017-02-11 ENCOUNTER — Encounter (HOSPITAL_BASED_OUTPATIENT_CLINIC_OR_DEPARTMENT_OTHER): Payer: Self-pay | Admitting: Emergency Medicine

## 2021-10-03 NOTE — Progress Notes (Signed)
NEW PATIENT Date of Service/Encounter:  10/05/21 Referring provider: Kathrin Penner, PA-C Primary care provider: Pcp, No  Subjective:  Tyler Golden is a 40 y.o. male with a PMHx of eosinophilic esophagitis presenting today for evaluation of eosinophilic esophagitis. History obtained from: chart review and patient.  Eosinophilic esophagitis:  He has had issues swallowing for many years, food would get lodged.  He finally went for a work-up and was diagnosed with EoE.  He has had several biopsies.  He has tried Microbiologist. He has tried inhaled fluticasone and omeprazole.  It helps some, but he continues to have symptoms.  He is now seeing a dietician who is helping with the dairy aspect.  He has not done budesonide slurries.  He has never had any concerns for food allergy with exception of peanut.  Peanuts have made him sick on his stomach in the past leading to vomiting, but no other symptoms.  Now he eats them - in small quantities - and does not have symptoms.  However,, he does notice that if he increases the quantity of peanut he consumes it will cause symptoms.  He is mostly eating peanut protein powder shakes.  He is unable to tolerate raw peanut without having some minor symptoms of stomach feeling upset.  He does have some seasonal allergies--during pollen seasons-eyes will get puffy, etc.  He does not take anything for it.     12/26/20-pathology reports:  EG junction: "Benign squamous mucosa with markedly increased intraepithelial  eosinophils, more than 40 in a single high-power field. Chronic active gastritis with focal pancreatic acinar cell metaplasia.  Negative for intestinal metaplasia.  Negative for dysplasia and malignancy." Esophagus: Active esophagitis with focal parakeratosis and increased intraepithelial eosinophils, up to 25 in a single high-power field." H. Pylori stain negative.   Other allergy screening: Asthma: no Medication allergy:  no Hymenoptera allergy: no Urticaria: no Eczema: mild hand eczema-uses steroid creams very infrequently  Past Medical History: Past Medical History:  Diagnosis Date   Eczema    Pleurisy    Medication List:  Current Outpatient Medications  Medication Sig Dispense Refill   chlorpheniramine-HYDROcodone (TUSSIONEX PENNKINETIC ER) 10-8 MG/5ML LQCR Take 5 mLs by mouth every 12 (twelve) hours as needed (for cough). 115 mL 0   diclofenac Sodium (VOLTAREN) 1 % GEL APPLY 4 GRAMS TO AFFECTED AREA FOUR TIMES A DAY AS NEEDED AS DIRECTED. APPLY TO BOTH KNEES UP TO 3-4 TIMES A DAY AS NEEDED FOR PAIN.  PLEASE ENSURE GOOD HANDWASHING TECHNIQUES AFTER EACH APPLICATION AS DIRECTED.  APPLY TO BOTH KNEES UP TO 3-4 TIMES A DAY AS NEEDED FOR PAIN.  PLEASE ENSURE GOOD HANDWASHING TECHNIQUES AFTER EACH APPLICATION     fluticasone (FLOVENT HFA) 220 MCG/ACT inhaler INHALE 2 PUFFS BY MOUTH TWICE A DAY (RINSE MOUTH WELL WITH WATER) SWALLOW WITHOUT SPACER AS DIRECTED (APPROVED)     guaiFENesin-codeine 100-10 MG/5ML syrup Take 10 mLs by mouth every 6 (six) hours as needed for cough. 120 mL 0   loperamide (IMODIUM) 2 MG capsule Take 1 capsule (2 mg total) by mouth 4 (four) times daily as needed for diarrhea or loose stools. 12 capsule 0   omeprazole (PRILOSEC) 20 MG capsule Take 1 capsule by mouth daily.     ondansetron (ZOFRAN ODT) 4 MG disintegrating tablet 4mg  ODT q4 hours prn nausea/vomit 10 tablet 0   sodium chloride (OCEAN) 0.65 % SOLN nasal spray Place 1 spray into the nose as needed for congestion. 60 mL 0  SUMAtriptan (IMITREX) 100 MG tablet TAKE ONE TABLET BY MOUTH AS DIRECTED (FIRST DOSE AT ONSET OF HEADACHE,THEN REPEAT AFTER 2 HOURS IF NO RELIEF-NO MORE THAN 200 MG PER DAY)     albuterol (PROVENTIL HFA;VENTOLIN HFA) 108 (90 BASE) MCG/ACT inhaler Inhale 1-2 puffs into the lungs every 6 (six) hours as needed for wheezing. (Patient not taking: Reported on 10/05/2021) 1 Inhaler 0   azithromycin (ZITHROMAX) 250 MG  tablet Take 1 tablet (250 mg total) by mouth daily. Take first 2 tablets together, then 1 every day until finished. (Patient not taking: Reported on 10/05/2021) 6 tablet 0   No current facility-administered medications for this visit.   Known Allergies:  No Known Allergies Past Surgical History: History reviewed. No pertinent surgical history. Family History: Family History  Problem Relation Age of Onset   Allergic rhinitis Child    Social History: Tyler Golden lives in a house without water damage, wood floors, Architectural technologist, central AC, pet dog, no roaches, using dust mite protection on bedding, he vapes since 2011.  He works in delivery for 3 years.  No HEPA filter in the home.  [place]   ROS:  All other systems negative except as noted per HPI.  Objective:  Blood pressure 112/62, pulse 60, temperature 98.1 F (36.7 C), temperature source Temporal, resp. rate 17, height 6' 0.25" (1.835 m), weight 182 lb 6.4 oz (82.7 kg), SpO2 99 %. Body mass index is 24.57 kg/m. Physical Exam:  General Appearance:  Alert, cooperative, no distress, appears stated age  Head:  Normocephalic, without obvious abnormality, atraumatic  Eyes:  Conjunctiva clear, EOM's intact  Nose: Nares normal, normal mucosa  Throat: Lips, tongue normal; teeth and gums normal, normal posterior oropharynx  Neck: Supple, symmetrical  Lungs:   Clear to auscultation bilaterally, respirations unlabored, no coughing  Heart:  Regular rate and rhythm, no murmur, appears well perfused  Extremities: No edema  Skin: Skin color, texture, turgor normal, no rashes or lesions on visualized portions of skin  Neurologic: No gross deficits     Diagnostics: Skin Testing:  Environmental allergy panel and food allergy panel . Positive test to: Peanuts only.  Adequate positive and negative controls. Results discussed with patient/family.  Airborne Adult Perc - 10/05/21 1021     Time Antigen Placed  1021    Allergen Manufacturer Waynette Buttery    Location Back    Number of Test 59    1. Control-Buffer 50% Glycerol Negative    2. Control-Histamine 1 mg/ml 3+    3. Albumin saline Negative    4. Bahia Negative    5. French Southern Territories Negative    6. Johnson Negative    7. Kentucky Blue Negative    8. Meadow Fescue Negative    9. Perennial Rye Negative    10. Sweet Vernal Negative    11. Timothy Negative    12. Cocklebur Negative    13. Burweed Marshelder Negative    14. Ragweed, short Negative    15. Ragweed, Giant Negative    16. Plantain,  English Negative    17. Lamb's Quarters Negative    18. Sheep Sorrell Negative    19. Rough Pigweed Negative    20. Marsh Elder, Rough Negative    21. Mugwort, Common Negative    22. Ash mix Negative    23. Birch mix Negative    24. Beech American Negative    25. Box, Elder Negative    26. Cedar, red Negative  27. Cottonwood, Eastern Negative    28. Elm mix Negative    29. Hickory Negative    30. Maple mix Negative    31. Oak, Guinea-[place]au mix Negative    32. Pecan Pollen Negative    33. Pine mix Negative    34. Sycamore Eastern Negative    35. Walnut, Black Pollen Negative    36. Alternaria alternata Negative    37. Cladosporium Herbarum Negative    38. Aspergillus mix Negative    39. Penicillium mix Negative    40. Bipolaris sorokiniana (Helminthosporium) Negative    41. Drechslera spicifera (Curvularia) Negative    42. Mucor plumbeus Negative    43. Fusarium moniliforme Negative    44. Aureobasidium pullulans (pullulara) Negative    45. Rhizopus oryzae Negative    46. Botrytis cinera Negative    47. Epicoccum nigrum Negative    48. Phoma betae Negative    49. Candida Albicans Negative    50. Trichophyton mentagrophytes Negative    51. Mite, D Farinae  5,000 AU/ml Negative    52. Mite, D Pteronyssinus  5,000 AU/ml Negative    53. Cat Hair 10,000 BAU/ml Negative    54.  Dog Epithelia Negative    55. Mixed Feathers Negative    56. Horse  Epithelia Negative    57. Cockroach, German Negative    58. Mouse Negative    59. Tobacco Leaf Negative             Food Adult Perc - 10/05/21 1000     Time Antigen Placed 1022    Allergen Manufacturer Waynette Buttery    Location Back    Number of allergen test 72    Control-Histamine 1 mg/ml 3+    1. Peanut --   6*15   2. Soybean Negative    3. Wheat Negative    4. Sesame Negative    5. Milk, cow Negative    6. Egg White, Chicken Negative    7. Casein Negative    8. Shellfish Mix Negative    9. Fish Mix Negative    10. Cashew Negative    11. Pecan Food Negative    12. Walnut Food Negative    13. Almond Negative    14. Hazelnut Negative    15. [place] nut Negative    16. Coconut Negative    17. Pistachio Negative    18. Catfish Negative    19. Bass Negative    20. Trout Negative    21. Tuna Negative    22. Salmon Negative    23. Flounder Negative    24. Codfish Negative    25. Shrimp Negative    26. Crab Negative    27. Lobster Negative    28. Oyster Negative    29. Scallops Negative    30. Barley Negative    31. Oat  Negative    32. Rye  Negative    33. Hops Negative    34. Rice Negative    35. Cottonseed Negative    36. Saccharomyces Cerevisiae  Negative    37. Pork Negative    38. [place] Meat Negative    39. Chicken Meat Negative    40. Beef Negative    41. Lamb Negative    42. Tomato Negative    43. White Potato Negative    44. Sweet Potato Negative    45. Pea, Green/English Negative    46. Navy Bean Negative    47. Mushrooms Negative  48. Avocado Negative    49. Onion Negative    50. Cabbage Negative    51. Carrots Negative    52. Celery Negative    53. Corn Negative    54. Cucumber Negative    55. Grape (White seedless) Negative    56. Orange  Negative    57. Banana Negative    58. Apple Negative    59. Peach Negative    60. Strawberry Negative    61. Cantaloupe Negative    62. Watermelon Negative    63. Pineapple Negative    64.  Chocolate/Cacao bean Negative    65. Karaya Gum Negative    66. Acacia (Arabic Gum) Negative    67. Cinnamon Negative    68. Nutmeg Negative    69. Ginger Negative    70. Garlic Negative    71. Pepper, black Negative    72. Mustard Negative             Allergy testing results were read and interpreted by myself, documented by clinical staff.  Assessment and Plan  Discussed several treatment options for eosinophilic esophagitis including budesonide slurry's, food elimination diets, and Dupixent injections.  He feels that when he is avoiding dairy his symptoms are mostly controlled.  He does take swallowed famotidine and omeprazole daily.  We did obtain a food allergy testing to be used for elimination diet in the future if needed.  The only food he was sensitized to was peanuts.  However, he has a concerning reaction for true food allergy to peanuts, so I have advised strict avoidance of these going forward. At this time, he is uninterested in other additional therapies because he feels overall much improved in his symptoms.  We agreed to touch base in 6 months to see if this is still the case, and he will contact us sooner if he decides to move forward with one of the other therapy options.  We also obtain environmental allergy testing given his history of chronic rhinitis.  However, these were negative.  He does not report any worsening of his EOE symptoms during a certain season, so we only discussed symptomatic care of rhinitis symptoms based on negative allergy testing.  Patient Instructions  Eosinophilic esophagitis (EoE)  - skin testing today was negative to all foods except PEANUT (which you have had concerning reaction to in the past, and should avoid) - okay to avoid milk since this is helping with your EoE symptoms - environmental skin testing was also negative - continue management as per your gastroenterologist - if you decide you would like to try Dupixent (dupilumab) in  the future, we will first need to try budesonide slurries   Food allergy:  - today's skin testing was positive to peanuts - please strictly avoid peanuts - for SKIN only reaction, okay to take Benadryl 2 capsules every 4 hours - for SKIN + ANY additional symptoms, OR IF concern for LIFE THREATENING reaction = Epipen Autoinjector EpiPen 0.3 mg. - If using Epinephrine autoinjector, call 911 - A food allergy action plan has been provided and discussed. - Medic Alert identification is recommended.  Follow-up in 6 months, sooner if needed. This note in its entirety was forwarded to the Provider who requested this consultation.  Thank you for your kind referral. I appreciate the opportunity to take part in Felton's care. Please do not hesitate to contact me with questions.  Sincerely,  Tonny Bollman, MD [facility]

## 2021-10-05 ENCOUNTER — Ambulatory Visit (INDEPENDENT_AMBULATORY_CARE_PROVIDER_SITE_OTHER): Payer: No Typology Code available for payment source | Admitting: Internal Medicine

## 2021-10-05 ENCOUNTER — Other Ambulatory Visit: Payer: Self-pay

## 2021-10-05 ENCOUNTER — Encounter: Payer: Self-pay | Admitting: Internal Medicine

## 2021-10-05 VITALS — BP 112/62 | HR 60 | Temp 98.1°F | Resp 17 | Ht 72.25 in | Wt 182.4 lb

## 2021-10-05 DIAGNOSIS — K200 Eosinophilic esophagitis: Secondary | ICD-10-CM

## 2021-10-05 DIAGNOSIS — J310 Chronic rhinitis: Secondary | ICD-10-CM

## 2021-10-05 MED ORDER — EPINEPHRINE 0.3 MG/0.3ML IJ SOAJ: 0.3000 mg | Freq: Once | INTRAMUSCULAR | 2 refills | Status: AC

## 2021-10-05 NOTE — Patient Instructions (Signed)
Eosinophilic esophagitis (EoE)  - skin testing today was negative to all foods except PEANUT (which you have had concerning reaction to in the past, and should avoid) - okay to avoid milk since this is helping with your EoE symptoms - environmental skin testing was also negative - continue management as per your gastroenterologist - if you decide you would like to try Dupixent (dupilumab) in the future, we will first need to try budesonide slurries   Food allergy:  - today's skin testing was positive to peanuts - please strictly avoid peanuts - for SKIN only reaction, okay to take Benadryl 2 capsules every 4 hours - for SKIN + ANY additional symptoms, OR IF concern for LIFE THREATENING reaction = Epipen Autoinjector EpiPen 0.3 mg. - If using Epinephrine autoinjector, call 911 - A food allergy action plan has been provided and discussed. - Medic Alert identification is recommended.  Follow-up in 6 months, sooner if needed.

## 2022-04-04 NOTE — Progress Notes (Signed)
FOLLOW UP Date of Service/Encounter:  04/05/22   Subjective:  Tyler Golden (DOB: Dec 12, 1980) is a 41 y.o. male [facility] in re-evaluation of the following: EoE, peanut allergy History obtained from: chart review and patient  For Review, LV was on 10/05/22  with Dr.Dora Clauss seen for intial visit for EoE, peanut allergy, chronic rhinitis . "Discussed several treatment options for eosinophilic esophagitis including budesonide slurry's, food elimination diets, and Dupixent injections.  He feels that when he is avoiding dairy his symptoms are mostly controlled.  He does take swallowed famotidine and omeprazole daily.  We did obtain a food allergy testing to be used for elimination diet in the future if needed.  The only food he was sensitized to was peanuts.  However, he has a concerning reaction for true food allergy to peanuts, so I have advised strict avoidance of these going forward.At this time, he is uninterested in other additional therapies because he feels overall much improved in his symptoms.  We agreed to touch base in 6 months to see if this is still the case, and he will contact us sooner if he decides to move forward with one of the other therapy options."  12/26/20-pathology reports:  EG junction: "Benign squamous mucosa with markedly increased intraepithelial  eosinophils, more than 40 in a single high-power field. Chronic active gastritis with focal pancreatic acinar cell metaplasia.  Negative for intestinal metaplasia.  Negative for dysplasia and malignancy." Esophagus: Active esophagitis with focal parakeratosis and increased intraepithelial eosinophils, up to 25 in a single high-power field." H. Pylori stain negative. - SPT environmental panel 10/05/21-negative - SPT food panel- positive to peanut; hx of reaction to peanut- vomiting/nausea/abdominal pain, but no other symptoms  Today presents for follow-up. EoE-for the most  part he is avoiding dairy. He will occasionally have cheese cake and this doesn't seem to bother him much. He switched his protein powder to one that doesn't use whey and this has helped with his symptoms significantly. No throat tightness, bolus sensation, has some abdominal pain and reflux rarely. He is eating steak now without any symptoms and is really excited about this. He is taking omeprazole daily but not the swallowed flovent.  He continues to follow with GI at the [place].  Peanut allergy-he is completely avoiding, no accidental exposures. Carries his epipen.   Allergies as of 04/05/2022   No Known Allergies      Medication List        Accurate as of April 05, 2022 12:06 PM. If you have any questions, ask your nurse or doctor.          STOP taking these medications    azithromycin 250 MG tablet Commonly known as: ZITHROMAX Stopped by: Tonny Bollman, MD   loperamide 2 MG capsule Commonly known as: IMODIUM Stopped by: Tonny Bollman, MD   ondansetron 4 MG disintegrating tablet Commonly known as: Zofran ODT Stopped by: Tonny Bollman, MD   sodium chloride 0.65 % Soln nasal spray Commonly known as: OCEAN Stopped by: Tonny Bollman, MD       TAKE these medications    albuterol 108 (90 Base) MCG/ACT inhaler Commonly known as: VENTOLIN HFA Inhale 1-2 puffs into the lungs every 6 (six) hours as needed for wheezing.   chlorpheniramine-HYDROcodone 10-8 MG/5ML Lqcr Commonly known as: Tussionex Pennkinetic ER Take 5 mLs by mouth every 12 (twelve) hours as needed (for cough).   diclofenac Sodium 1 % Gel Commonly known as:  VOLTAREN APPLY 4 GRAMS TO AFFECTED AREA FOUR TIMES A DAY AS NEEDED AS DIRECTED. APPLY TO BOTH KNEES UP TO 3-4 TIMES A DAY AS NEEDED FOR PAIN.  PLEASE ENSURE GOOD HANDWASHING TECHNIQUES AFTER EACH APPLICATION AS DIRECTED.  APPLY TO BOTH KNEES UP TO 3-4 TIMES A DAY AS NEEDED FOR PAIN.  PLEASE ENSURE GOOD HANDWASHING TECHNIQUES AFTER EACH APPLICATION   fluticasone  220 MCG/ACT inhaler Commonly known as: FLOVENT HFA INHALE 2 PUFFS BY MOUTH TWICE A DAY (RINSE MOUTH WELL WITH WATER) SWALLOW WITHOUT SPACER AS DIRECTED (APPROVED)   guaiFENesin-codeine 100-10 MG/5ML syrup Take 10 mLs by mouth every 6 (six) hours as needed for cough.   omeprazole 20 MG capsule Commonly known as: PRILOSEC Take 1 capsule by mouth daily.   SUMAtriptan 100 MG tablet Commonly known as: IMITREX TAKE ONE TABLET BY MOUTH AS DIRECTED (FIRST DOSE AT ONSET OF HEADACHE,THEN REPEAT AFTER 2 HOURS IF NO RELIEF-NO MORE THAN 200 MG PER DAY)       Past Medical History:  Diagnosis Date   Eczema    Pleurisy    History reviewed. No pertinent surgical history. Otherwise, there have been no changes to his past medical history, surgical history, family history, or social history.  ROS: All others negative except as noted per HPI.   Objective:  BP 108/64   Pulse 84   Temp 98.8 F (37.1 C) (Temporal)   Resp 16   SpO2 98%  There is no height or weight on file to calculate BMI. Physical Exam: General Appearance:  Alert, cooperative, no distress, appears stated age  Head:  Normocephalic, without obvious abnormality, atraumatic  Eyes:  Conjunctiva clear, EOM's intact  Nose: Nares normal,  nasal piercings present and normal mucosa  Throat: Lips, tongue normal; teeth and gums normal, normal posterior oropharynx  Neck: Supple, symmetrical  Lungs:   clear to auscultation bilaterally, Respirations unlabored, no coughing  Heart:  regular rate and rhythm and no murmur, Appears well perfused  Extremities: No edema  Skin: Skin color, texture, turgor normal, no rashes or lesions on visualized portions of skin  Neurologic: No gross deficits    Assessment/Plan   Eosinophilic esophagitis (EoE)  - continue to avoid milk since this is helping with your EoE symptoms - continue management as per your gastroenterologist - if you decide you would like to try Dupixent (dupilumab) in the future,  we will first need to try budesonide slurries   Food allergy:  - please strictly avoid peanuts - for SKIN only reaction, okay to take Benadryl 2 capsules every 4 hours - for SKIN + ANY additional symptoms, OR IF concern for LIFE THREATENING reaction = Epipen Autoinjector EpiPen 0.3 mg. - If using Epinephrine autoinjector, call 911 - A food allergy action plan has been provided and discussed. - Medic Alert identification is recommended.  Follow-up in 12 months, sooner if needed for epipen refills. It was so great seeing you today!   Tonny Bollman, MD  [facility]

## 2022-04-05 ENCOUNTER — Encounter: Payer: Self-pay | Admitting: Internal Medicine

## 2022-04-05 ENCOUNTER — Ambulatory Visit (INDEPENDENT_AMBULATORY_CARE_PROVIDER_SITE_OTHER): Payer: No Typology Code available for payment source | Admitting: Internal Medicine

## 2022-04-05 VITALS — BP 108/64 | HR 84 | Temp 98.8°F | Resp 16

## 2022-04-05 DIAGNOSIS — J310 Chronic rhinitis: Secondary | ICD-10-CM | POA: Diagnosis not present

## 2022-04-05 DIAGNOSIS — K200 Eosinophilic esophagitis: Secondary | ICD-10-CM | POA: Diagnosis not present

## 2022-04-05 DIAGNOSIS — T7800XA Anaphylactic reaction due to unspecified food, initial encounter: Secondary | ICD-10-CM | POA: Insufficient documentation

## 2022-04-05 NOTE — Patient Instructions (Addendum)
Eosinophilic esophagitis (EoE)  - continue to avoid milk since this is helping with your EoE symptoms - continue management as per your gastroenterologist - if you decide you would like to try Dupixent (dupilumab) in the future, we will first need to try budesonide slurries   Food allergy:  - please strictly avoid peanuts - for SKIN only reaction, okay to take Benadryl 2 capsules every 4 hours - for SKIN + ANY additional symptoms, OR IF concern for LIFE THREATENING reaction = Epipen Autoinjector EpiPen 0.3 mg. - If using Epinephrine autoinjector, call 911 - A food allergy action plan has been provided and discussed. - Medic Alert identification is recommended.  Follow-up in 12 months, sooner if needed for epipen refills. It was so great seeing you today!

## 2023-03-13 ENCOUNTER — Telehealth: Payer: Self-pay | Admitting: Internal Medicine

## 2023-03-13 NOTE — Telephone Encounter (Signed)
Patient authorization([id number]) expires on 04/05/2023 and spoke with patient and advised him to call [facility] to request a new authorization.   Faxed renewal to [facility], [phone]
